# Patient Record
Sex: Male | Born: 1995
Health system: Southern US, Community
[De-identification: ages and names within clinical notes are randomized; demographics above are authoritative.]

## PROBLEM LIST (undated history)

## (undated) DIAGNOSIS — J45909 Unspecified asthma, uncomplicated: Secondary | ICD-10-CM

---
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
## 2015-11-14 ENCOUNTER — Ambulatory Visit (HOSPITAL_COMMUNITY): Admission: EM | Admit: 2015-11-14 | Discharge: 2015-11-14 | Discharge status: Absent without leave | Payer: Self-pay

## 2020-01-24 ENCOUNTER — Other Ambulatory Visit: Payer: Self-pay

## 2020-01-24 ENCOUNTER — Encounter: Payer: Self-pay | Admitting: Emergency Medicine

## 2020-01-24 ENCOUNTER — Emergency Department
Admission: EM | Admit: 2020-01-24 | Discharge: 2020-01-24 | Disposition: A | Discharge status: Home / Self Care | Payer: BC Managed Care – PPO | Source: Home / Self Care | Attending: Family Medicine | Admitting: Family Medicine

## 2020-01-24 ENCOUNTER — Emergency Department (INDEPENDENT_AMBULATORY_CARE_PROVIDER_SITE_OTHER): Payer: BC Managed Care – PPO

## 2020-01-24 ENCOUNTER — Emergency Department: Admit: 2020-01-24 | Discharge status: Absent without leave | Payer: Self-pay

## 2020-01-24 DIAGNOSIS — R197 Diarrhea, unspecified: Secondary | ICD-10-CM | POA: Diagnosis not present

## 2020-01-24 DIAGNOSIS — R112 Nausea with vomiting, unspecified: Secondary | ICD-10-CM | POA: Diagnosis not present

## 2020-01-24 DIAGNOSIS — R0600 Dyspnea, unspecified: Secondary | ICD-10-CM | POA: Diagnosis not present

## 2020-01-24 DIAGNOSIS — J069 Acute upper respiratory infection, unspecified: Secondary | ICD-10-CM

## 2020-01-24 DIAGNOSIS — R059 Cough, unspecified: Secondary | ICD-10-CM | POA: Diagnosis not present

## 2020-01-24 HISTORY — DX: Unspecified asthma, uncomplicated: J45.909

## 2020-01-24 LAB — POCT CBC W AUTO DIFF (K'VILLE URGENT CARE)

## 2020-01-24 MED ORDER — ONDANSETRON 4 MG PO TBDP: ORAL_TABLET | ORAL | 0 refills | Status: AC

## 2020-01-24 MED ORDER — PREDNISONE 20 MG PO TABS: ORAL_TABLET | ORAL | 0 refills | Status: AC

## 2020-01-24 MED ORDER — ONDANSETRON 4 MG PO TBDP
4.0000 mg | ORAL_TABLET | Freq: Once | ORAL | Status: AC
Start: 2020-01-24 — End: 2020-01-24
  Administered 2020-01-24: 4 mg via ORAL

## 2020-01-24 NOTE — ED Triage Notes (Signed)
Vomiting and Diarrhea started yesterday morning.Fatigue, body aches, runny nose. Not vaccinated, no flu shot

## 2020-01-24 NOTE — ED Provider Notes (Signed)
[facility]    CSN: [id number] Arrival date & time: 01/24/20  1445      History   Chief Complaint Chief Complaint  Patient presents with  . Emesis    HPI Shawn Ortiz is a 24 y.o. male.   Patient complains of fatigue, myalgias, and sinus congestion for about one week.  Three days ago he developed sore throat, cough, and tightness in his anterior chest.  He has felt winded with activity. Two days ago he developed nausea/vomiting and diarrhea.  He also complains of cold sweats and headache.  He denies changes in taste/smell. He has a past history of asthma as a child.  During the past several days he borrowed an albuterol inhaler from a friend.  The history is provided by the patient.    Past Medical History:  Diagnosis Date  . Asthma     Patient Active Problem List   Diagnosis Date Noted  . ASTHMA 12/27/2008  . CONTUSION OF FOOT 12/27/2008  . CONTUSION OF ANKLE 12/27/2008         Home Medications    Prior to Admission medications   Medication Sig Start Date End Date Taking? Authorizing Provider  acetaminophen (TYLENOL) 325 MG tablet Take 650 mg by mouth every 6 (six) hours as needed.   Yes [provider]  Aspirin-Salicylamide-Caffeine (BC HEADACHE POWDER PO) Take by mouth.   Yes [provider]  ondansetron (ZOFRAN ODT) 4 MG disintegrating tablet Take one tab by mouth Q6hr prn nausea 01/24/20   Lattie Haw, MD  predniSONE (DELTASONE) 20 MG tablet Take one tab by mouth twice daily for 4 days, then one daily.  Take with food. 01/24/20   Lattie Haw, MD    Family History Family History  Problem Relation Age of Onset  . Diabetes Father     Social History Social History   Tobacco Use  . Smoking status: Never Smoker  . Smokeless tobacco: Never Used  Vaping Use  . Vaping Use: Never used  Substance Use Topics  . Alcohol use: Yes  . Drug use: Not on file     Allergies   Penicillins   Review of Systems Review of  Systems  + sore throat + cough No pleuritic pain, but feels tight in anterior chest No wheezing + nasal congestion + post-nasal drainage No sinus pain/pressure No itchy/red eyes ? Earache + dizziness with movement No hemoptysis No SOB No fever, + chills/sweats + nausea + vomiting No abdominal pain + diarrhea No urinary symptoms No skin rash + fatigue + myalgias + headache Used OTC meds (Tylenol) without relief    Physical Exam Triage Vital Signs ED Triage Vitals  Enc Vitals Group     BP 01/24/20 1507 128/84     Pulse Rate 01/24/20 1507 99     Resp --      Temp 01/24/20 1507 98.5 F (36.9 C)     Temp Source 01/24/20 1507 Oral     SpO2 01/24/20 1507 96 %     Weight 01/24/20 1510 250 lb (113.4 kg)     Height 01/24/20 1510 6\' 3"  (1.905 m)     Head Circumference --      Peak Flow --      Pain Score 01/24/20 1509 6     Pain Loc --      Pain Edu? --      Excl. in GC? --    No data found.  Updated Vital Signs BP 128/84 (  BP Location: Right Arm)   Pulse 99   Temp 98.5 F (36.9 C) (Oral)   Ht 6\' 3"  (1.905 m)   Wt 113.4 kg   SpO2 96%   BMI 31.25 kg/m   Visual Acuity Right Eye Distance:   Left Eye Distance:   Bilateral Distance:    Right Eye Near:   Left Eye Near:    Bilateral Near:     Physical Exam Nursing notes and Vital Signs reviewed. Appearance:  Patient appears stated age, and in no acute distress Eyes:  Pupils are equal, round, and reactive to light and accomodation.  Extraocular movement is intact.  Conjunctivae are not inflamed  Ears:  Canals normal.  Tympanic membranes normal.  Nose:  Mildly congested turbinates.  No sinus tenderness.  Pharynx:  Normal Neck:  Supple.  Mildly enlarged lateral nodes are present, tender to palpation on the left. Lungs:  Clear to auscultation.  Breath sounds are equal.  Moving air well. Chest:  Tenderness right posterior chest Heart:  Regular rate and rhythm without murmurs, rubs, or gallops.  Abdomen:  Mild  diffuse tenderness without masses or hepatosplenomegaly.  Bowel sounds are present.  No CVA or flank tenderness.  Extremities:  No edema.  Skin:  No rash present.   UC Treatments / Results  Labs (all labs ordered are listed, but only abnormal results are displayed) Labs Reviewed  COVID-19, FLU A+B AND RSV  POCT CBC W AUTO DIFF ([facility]):  WBC 7.2 ; LY 21.4; MO 6.7; GR 71.9; Hgb 15.5; Platelets 264     EKG   Radiology DG Chest 2 View  Result Date: 01/24/2020 CLINICAL DATA:  Upper respiratory infection, cough, dyspnea, rhinorrhea EXAM: CHEST - 2 VIEW COMPARISON:  None. FINDINGS: The heart size and mediastinal contours are within normal limits. Both lungs are clear. The visualized skeletal structures are unremarkable. IMPRESSION: No active cardiopulmonary disease. Electronically Signed   By: 13/04/2019 M.D.   On: 01/24/2020 16:06    Procedures Procedures (including critical care time)  Medications Ordered in UC Medications  ondansetron (ZOFRAN-ODT) disintegrating tablet 4 mg (4 mg Oral Given 01/24/20 1546)    Initial Impression / Assessment and Plan / UC Course  I have reviewed the triage vital signs and the nursing notes.  Pertinent labs & imaging results that were available during my care of the patient were reviewed by me and considered in my medical decision making (see chart for details).    Normal CBC and chest x-ray reassuring There is no evidence of bacterial infection today.  Treat symptomatically for now. Because of past history of reactive airways disease, begin prednisone burst/taper. COVID PCR pending.    Final Clinical Impressions(s) / UC Diagnoses   Final diagnoses:  Nausea vomiting and diarrhea  Viral URI with cough     Discharge Instructions     Begin clear liquids for about 12 hours, then may begin a BRAT diet (Bananas, Rice, Applesauce, Toast) when nausea and dizziness resolved.  Then gradually advance to a regular diet as tolerated.      Take plain guaifenesin (1200mg  extended release tabs such as Mucinex) twice daily, with plenty of water, for cough and congestion.  May add Pseudoephedrine (30mg , one or two every 4 to 6 hours) for sinus congestion.  Get adequate rest.   May use Afrin nasal spray (or generic oxymetazoline) each morning for about 5 days and then discontinue.  Also recommend using saline nasal spray several times daily and saline nasal  irrigation (AYR is a common brand).  Use Flonase nasal spray each morning after using Afrin nasal spray and saline nasal irrigation. Try warm salt water gargles for sore throat.  May take Delsym Cough Suppressant ("12 Hour Cough Relief") at bedtime for nighttime cough.  Stop all antihistamines for now, and other non-prescription cough/cold preparations. May take Tylenol as needed for fever, body aches, etc. Use albuterol inhaler as needed.  Isolate yourself until COVID-19 test result is available.   If your COVID19 test is positive, then you are infected with the novel coronavirus and could give the virus to others.  Please continue isolation at home for at least 10 days since the start of your symptoms.  Once you complete your 10 day quarantine, you may return to normal activities as long as you've not had a fever for over 24 hours (without taking fever reducing medicine) and your symptoms are improving. Please continue good preventive care measures, including:  frequent hand-washing, avoid touching your face, cover coughs/sneezes, stay out of crowds and keep a 6 foot distance from others.  Go to the nearest hospital emergency room if fever/cough/breathlessness are severe or illness seems like a threat to life.     ED Prescriptions    Medication Sig Dispense Auth. Provider   predniSONE (DELTASONE) 20 MG tablet Take one tab by mouth twice daily for 4 days, then one daily.  Take with food. 12 tablet Lattie Haw, MD   ondansetron (ZOFRAN ODT) 4 MG disintegrating tablet Take one  tab by mouth Q6hr prn nausea 12 tablet Lattie Haw, MD        Lattie Haw, MD 01/27/20 2308

## 2020-01-24 NOTE — Discharge Instructions (Addendum)
Begin clear liquids for about 12 hours, then may begin a BRAT diet (Bananas, Rice, Applesauce, Toast) when nausea and dizziness resolved.  Then gradually advance to a regular diet as tolerated.    Take plain guaifenesin (1200mg  extended release tabs such as Mucinex) twice daily, with plenty of water, for cough and congestion.  May add Pseudoephedrine (30mg , one or two every 4 to 6 hours) for sinus congestion.  Get adequate rest.   May use Afrin nasal spray (or generic oxymetazoline) each morning for about 5 days and then discontinue.  Also recommend using saline nasal spray several times daily and saline nasal irrigation (AYR is a common brand).  Use Flonase nasal spray each morning after using Afrin nasal spray and saline nasal irrigation. Try warm salt water gargles for sore throat.  May take Delsym Cough Suppressant ("12 Hour Cough Relief") at bedtime for nighttime cough.  Stop all antihistamines for now, and other non-prescription cough/cold preparations. May take Tylenol as needed for fever, body aches, etc. Use albuterol inhaler as needed.  Isolate yourself until COVID-19 test result is available.   If your COVID19 test is positive, then you are infected with the novel coronavirus and could give the virus to others.  Please continue isolation at home for at least 10 days since the start of your symptoms.  Once you complete your 10 day quarantine, you may return to normal activities as long as you've not had a fever for over 24 hours (without taking fever reducing medicine) and your symptoms are improving. Please continue good preventive care measures, including:  frequent hand-washing, avoid touching your face, cover coughs/sneezes, stay out of crowds and keep a 6 foot distance from others.  Go to the nearest hospital emergency room if fever/cough/breathlessness are severe or illness seems like a threat to life.

## 2020-01-26 LAB — COVID-19, FLU A+B AND RSV
Influenza A, NAA: NOT DETECTED
Influenza B, NAA: NOT DETECTED
RSV, NAA: NOT DETECTED
SARS-CoV-2, NAA: NOT DETECTED

## 2022-02-01 IMAGING — DX DG CHEST 2V
2 series · 2 of 2 positions shown · non-contrast
Comparison: None.

CLINICAL DATA: Upper respiratory infection, cough, dyspnea,
rhinorrhea

EXAM:
CHEST - 2 VIEW

[chest pa]
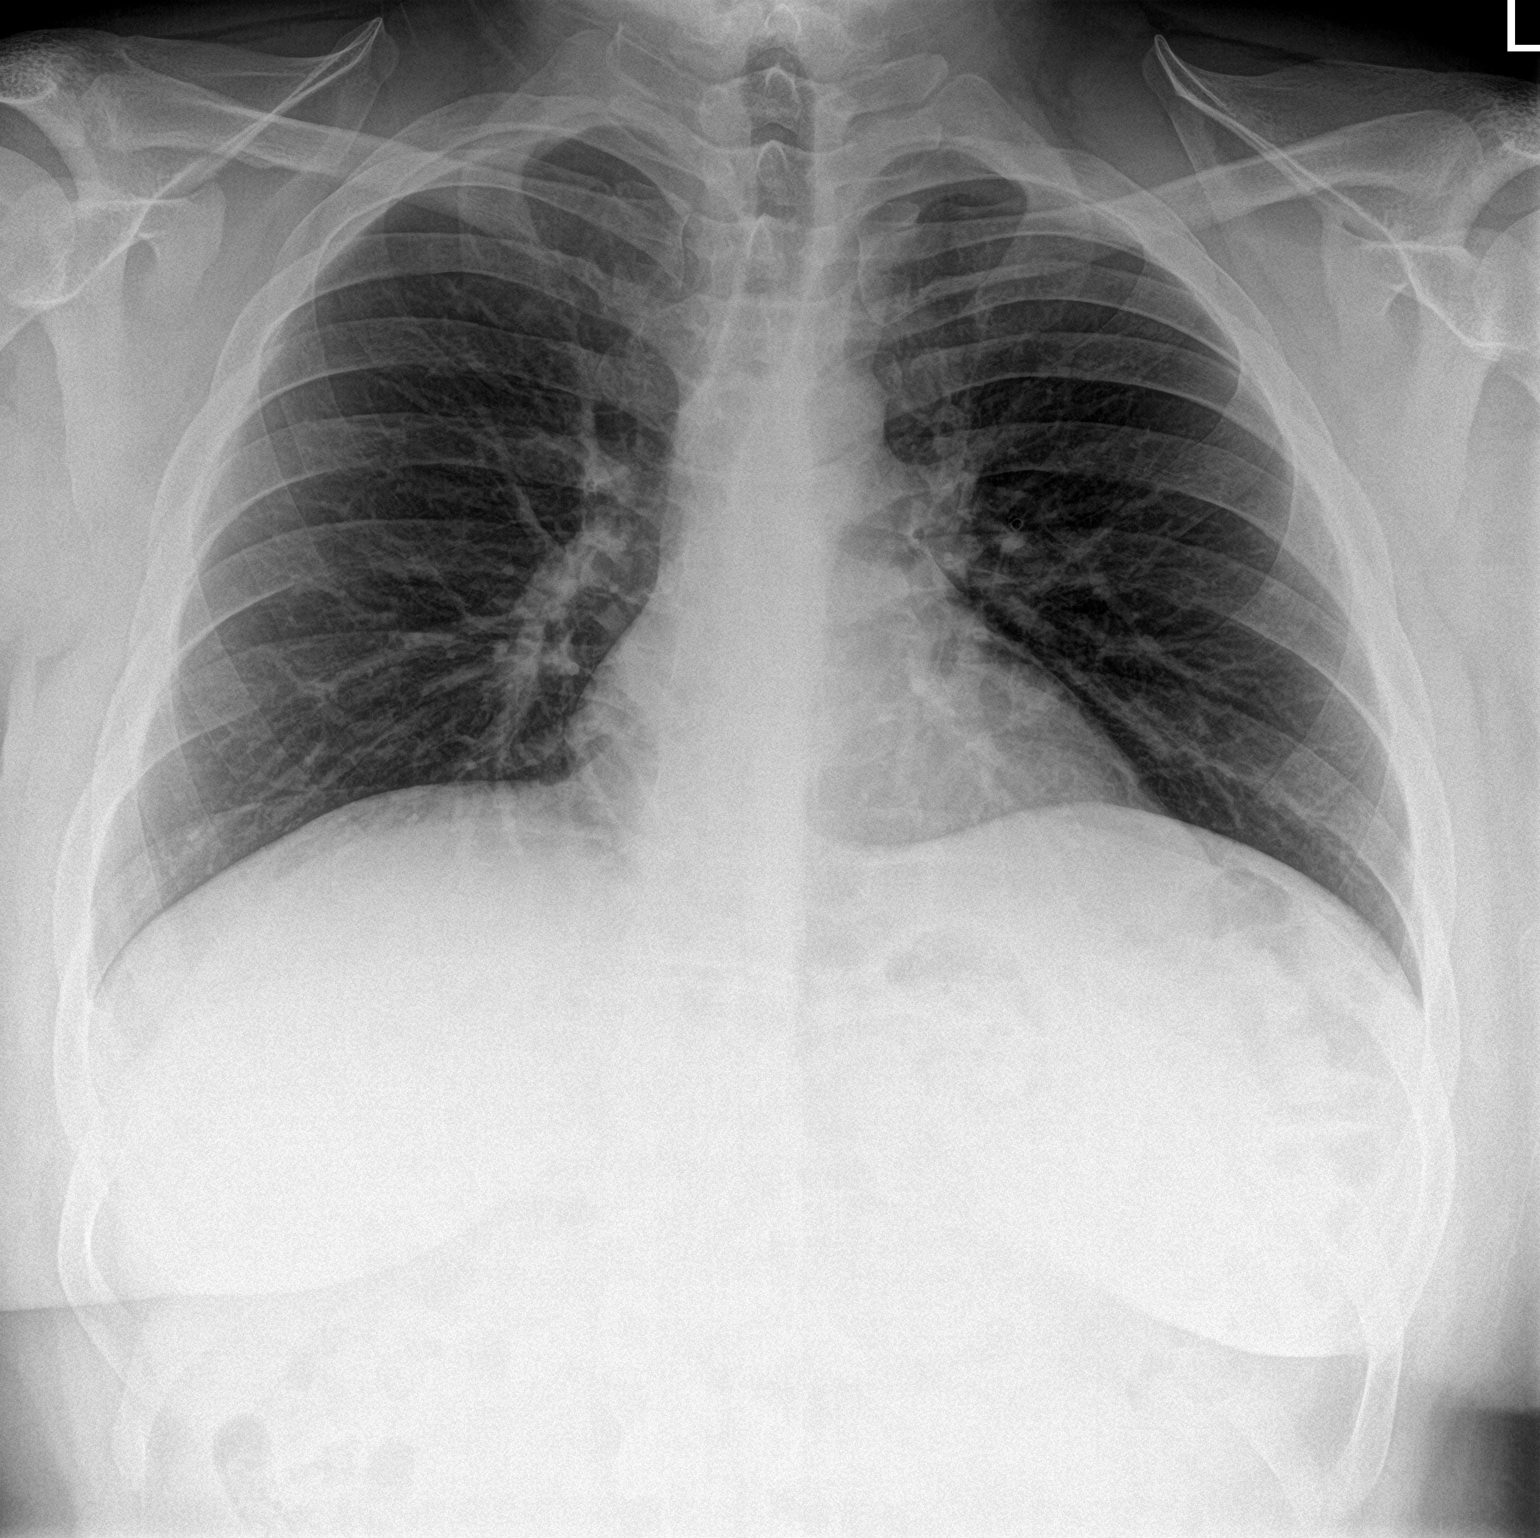

[chest lat]
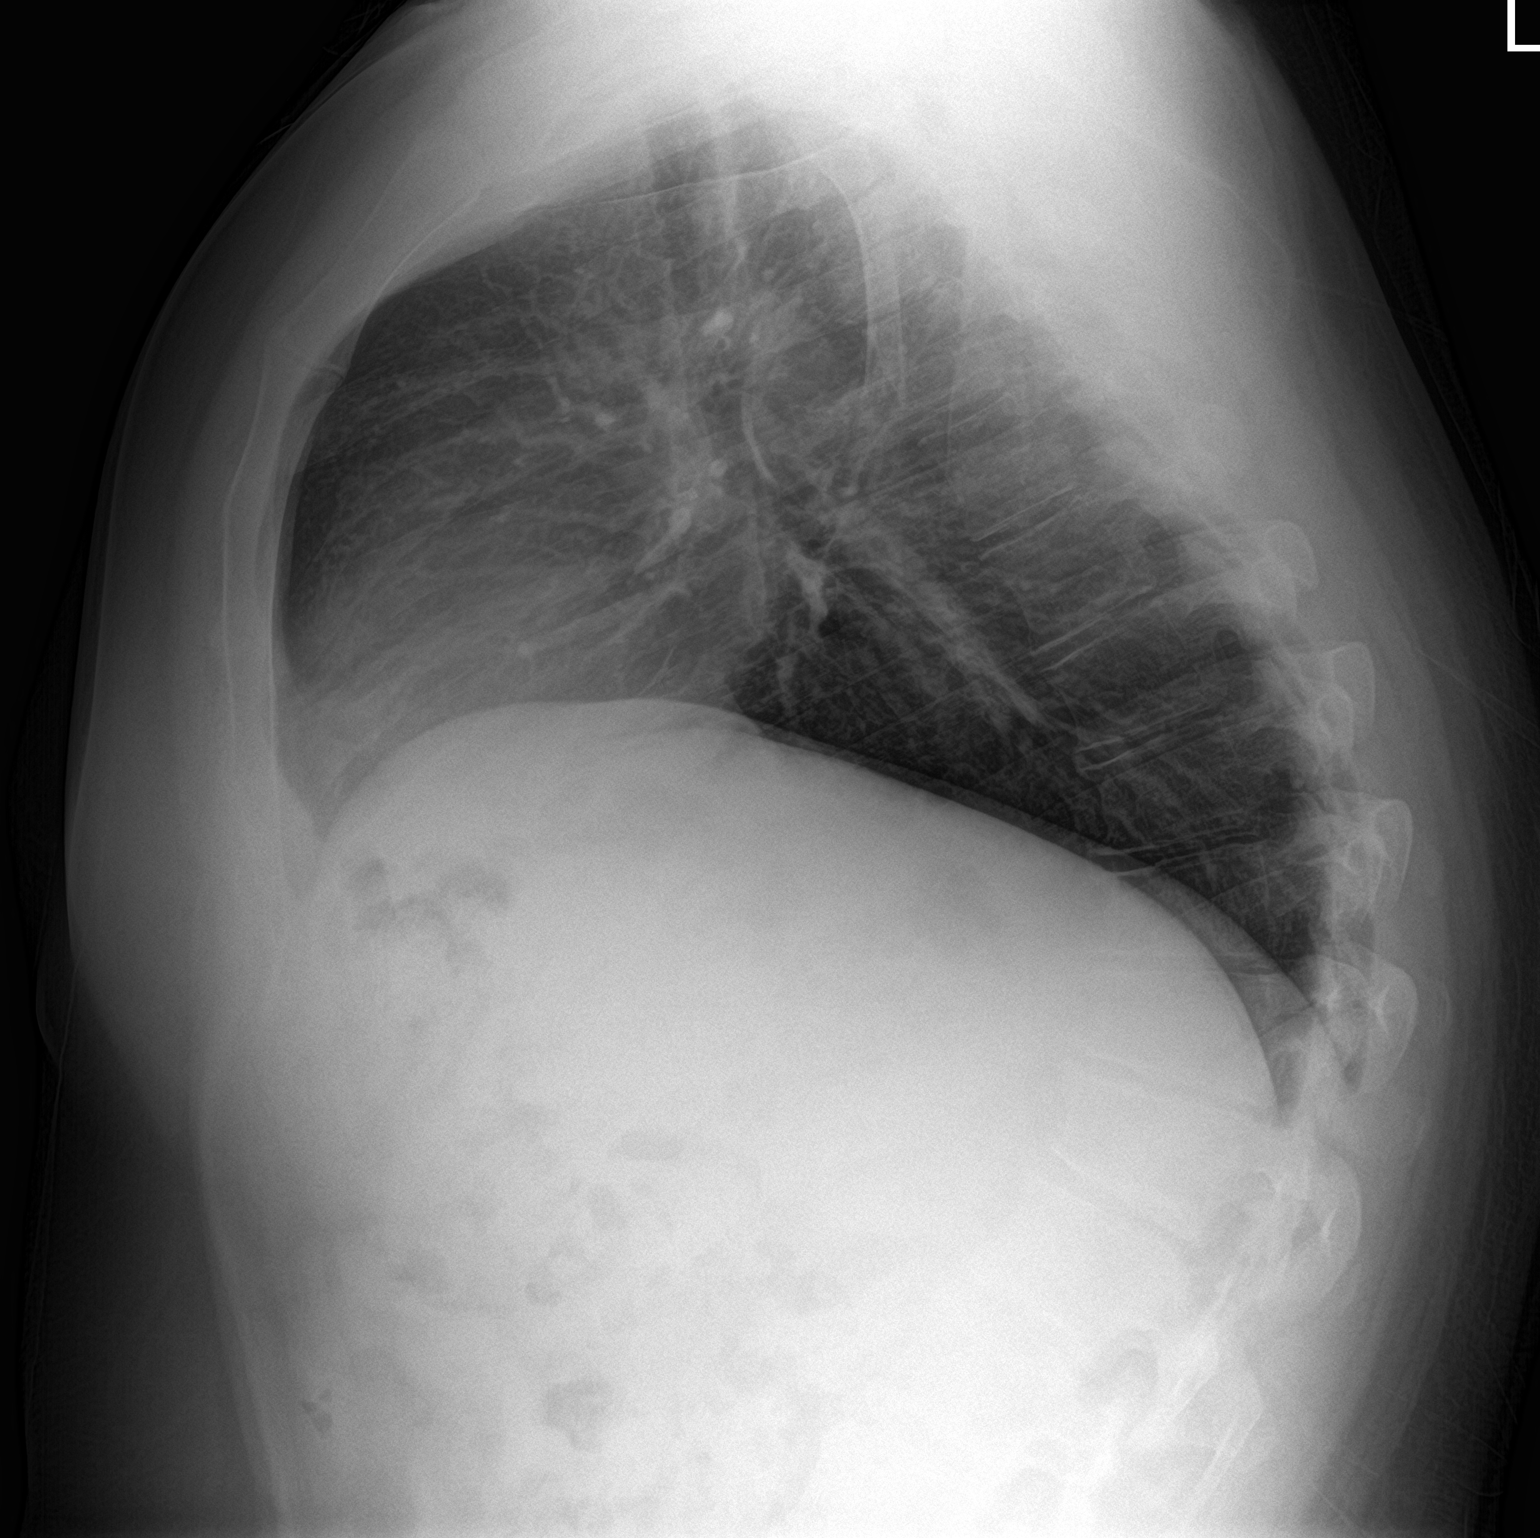

[2 of 2 positions shown; findings below may reference images not displayed]

FINDINGS: The heart size and mediastinal contours are within normal limits.
Both lungs are clear. The visualized skeletal structures are
unremarkable.
IMPRESSION: No active cardiopulmonary disease.
